# Patient Record
Sex: Male | Born: 1975 | Race: White | Hispanic: No | Marital: Single | Smoking: Never smoker
Health system: Southern US, Community
[De-identification: ages and names within clinical notes are randomized; demographics above are authoritative.]

## PROBLEM LIST (undated history)

## (undated) DIAGNOSIS — K219 Gastro-esophageal reflux disease without esophagitis: Secondary | ICD-10-CM

## (undated) DIAGNOSIS — G43019 Migraine without aura, intractable, without status migrainosus: Secondary | ICD-10-CM

## (undated) DIAGNOSIS — I10 Essential (primary) hypertension: Secondary | ICD-10-CM

## (undated) DIAGNOSIS — G43909 Migraine, unspecified, not intractable, without status migrainosus: Secondary | ICD-10-CM

## (undated) DIAGNOSIS — J45909 Unspecified asthma, uncomplicated: Secondary | ICD-10-CM

## (undated) HISTORY — DX: Gastro-esophageal reflux disease without esophagitis: K21.9

## (undated) HISTORY — DX: Migraine, unspecified, not intractable, without status migrainosus: G43.909

## (undated) HISTORY — DX: Unspecified asthma, uncomplicated: J45.909

## (undated) HISTORY — DX: Migraine without aura, intractable, without status migrainosus: G43.019

---

## 2001-06-14 HISTORY — PX: TUMOR EXCISION: SHX421

## 2015-05-27 ENCOUNTER — Other Ambulatory Visit: Payer: Self-pay | Admitting: Orthopaedic Surgery

## 2015-05-27 DIAGNOSIS — M5000 Cervical disc disorder with myelopathy, unspecified cervical region: Secondary | ICD-10-CM

## 2015-06-18 ENCOUNTER — Ambulatory Visit
Admission: RE | Admit: 2015-06-18 | Discharge: 2015-06-18 | Disposition: A | Discharge status: Home / Self Care | Payer: BLUE CROSS/BLUE SHIELD | Source: Ambulatory Visit | Attending: Orthopaedic Surgery | Admitting: Orthopaedic Surgery

## 2015-06-18 DIAGNOSIS — M5000 Cervical disc disorder with myelopathy, unspecified cervical region: Secondary | ICD-10-CM

## 2016-04-26 DIAGNOSIS — R200 Anesthesia of skin: Secondary | ICD-10-CM | POA: Diagnosis not present

## 2016-04-26 DIAGNOSIS — J4530 Mild persistent asthma, uncomplicated: Secondary | ICD-10-CM | POA: Diagnosis not present

## 2016-04-26 DIAGNOSIS — M79602 Pain in left arm: Secondary | ICD-10-CM | POA: Diagnosis not present

## 2016-04-26 DIAGNOSIS — Z23 Encounter for immunization: Secondary | ICD-10-CM | POA: Diagnosis not present

## 2016-06-17 ENCOUNTER — Telehealth: Payer: Self-pay

## 2016-06-17 ENCOUNTER — Ambulatory Visit: Payer: BLUE CROSS/BLUE SHIELD | Admitting: Neurology

## 2016-06-17 NOTE — Telephone Encounter (Signed)
Pt no showed his new patient appt today. 

## 2016-06-22 ENCOUNTER — Encounter: Payer: Self-pay | Admitting: Neurology

## 2016-07-07 ENCOUNTER — Encounter: Payer: Self-pay | Admitting: Neurology

## 2016-07-07 ENCOUNTER — Ambulatory Visit (INDEPENDENT_AMBULATORY_CARE_PROVIDER_SITE_OTHER): Payer: BLUE CROSS/BLUE SHIELD | Admitting: Neurology

## 2016-07-07 VITALS — BP 141/101 | HR 79 | Ht 68.0 in | Wt 230.5 lb

## 2016-07-07 DIAGNOSIS — Z5181 Encounter for therapeutic drug level monitoring: Secondary | ICD-10-CM

## 2016-07-07 DIAGNOSIS — G43019 Migraine without aura, intractable, without status migrainosus: Secondary | ICD-10-CM | POA: Diagnosis not present

## 2016-07-07 DIAGNOSIS — H5310 Unspecified subjective visual disturbances: Secondary | ICD-10-CM

## 2016-07-07 DIAGNOSIS — R202 Paresthesia of skin: Secondary | ICD-10-CM | POA: Diagnosis not present

## 2016-07-07 HISTORY — DX: Migraine without aura, intractable, without status migrainosus: G43.019

## 2016-07-07 NOTE — Progress Notes (Signed)
Reason for visit: Left sided numbness  Referring physician: Dr. Loren Racer Troy Levy is a 41 y.o. male  History of present illness:  Troy Levy is a 41 year old right-handed white male with a history of migraine headache throughout his life. The headache has been very frequent, and has been daily at times. The patient still has very frequent headaches, most days of the week will be associated with some headache. He indicates that he has been treated with Depakote and Topamax and Imitrex in the past without benefit. The headaches are tolerable for him at this point. The patient indicates that he has had a history of several concussions in the past. He has had some visual field changes with total loss of vision that would come and go, unassociated with the headache. The patient indicates that the headaches are associated with weather changes as a frequent activator. The patient comes to this office for an evaluation of left-sided paresthesias that began in October 2016. The patient claims that he has had similar episodes in the past that have resolved. This most recent episode has persisted over the last several months. The patient noted onset of left neck and shoulder discomfort in August 2016, with onset of discomfort down the left arm and numbness in the left hand. Within several weeks, the patient began developing numbness in the left foot spreading up the leg, and by January 2017 the left face was involved with numbness as well. The patient was seen by Dr. Noel Gerold, and MRI evaluation of the cervical spine was done and was relatively unremarkable without a source of the left arm and shoulder discomfort noted. The patient denies any weakness of the extremities, or any change in balance. He denies issues controlling the bowels or the bladder. He does believe that there has been some change in vision of the left eye. He still has some left neck and shoulder discomfort, may have some mild pain when  turning his head to the left. The patient claims that he has had nerve conduction studies done within the last year and a half and he was told that he had "nerve damage" in the left arm. The results of this study are not available to me. He comes to this office for further evaluation.  Past Medical History:  Diagnosis Date  . Acid reflux   . Asthma   . Common migraine with intractable migraine 07/07/2016  . Migraine     Past Surgical History:  Procedure Laterality Date  . TUMOR EXCISION  2003   Upper spine    Family History  Problem Relation Age of Onset  . Diabetes Mother   . Diabetes Father   . High blood pressure Father   . High Cholesterol Father     Social history:  reports that he has never smoked. He has never used smokeless tobacco. He reports that he does not drink alcohol or use drugs.  Medications:  Prior to Admission medications   Medication Sig Start Date End Date Taking? Authorizing Provider  DULERA 100-5 MCG/ACT AERO Inhale 2 puffs into the lungs 2 (two) times daily. 06/19/16  Yes Historical Provider, MD  Multiple Vitamins-Minerals (CENTRUM ADULTS PO) Take 1 tablet by mouth daily.   Yes Historical Provider, MD  omeprazole (PRILOSEC) 20 MG capsule Take 20 mg by mouth daily as needed.  05/23/16  Yes Historical Provider, MD     No Known Allergies  ROS:  Out of a complete 14 system review of symptoms, the patient  complains only of the following symptoms, and all other reviewed systems are negative.  Numbness  Blood pressure (!) 141/101, pulse 79, height 5\' 8"  (1.727 m), weight 230 lb 8 oz (104.6 kg).  Physical Exam  General: The patient is alert and cooperative at the time of the examination. The patient is moderately obese.  Eyes: Pupils are equal, round, and reactive to light. Discs are flat bilaterally.  Neck: The neck is supple, no carotid bruits are noted.  Respiratory: The respiratory examination is clear.  Cardiovascular: The cardiovascular  examination reveals a regular rate and rhythm, no obvious murmurs or rubs are noted.  Skin: Extremities are without significant edema.  Neurologic Exam  Mental status: The patient is alert and oriented x 3 at the time of the examination. The patient has apparent normal recent and remote memory, with an apparently normal attention span and concentration ability.  Cranial nerves: Facial symmetry is present. There is good sensation of the face to pinprick and soft touch on the right, decreased on the left face. The patient splits the midline with vibration sensation, decreased on the left. The strength of the facial muscles and the muscles to head turning and shoulder shrug are normal bilaterally. Speech is well enunciated, no aphasia or dysarthria is noted. Extraocular movements are full. Visual fields are full. The tongue is midline, and the patient has symmetric elevation of the soft palate. No obvious hearing deficits are noted.  Motor: The motor testing reveals 5 over 5 strength of all 4 extremities. Good symmetric motor tone is noted throughout.  Sensory: Sensory testing is intact to pinprick, soft touch, vibration sensation, and position sense on the right extremities. The patient reports decreased pinprick and vibration sensation on the left arm and left leg, position sense is intact on all 4 extremities. No evidence of extinction is noted.  Coordination: Cerebellar testing reveals good finger-nose-finger and heel-to-shin bilaterally.  Gait and station: Gait is normal. Tandem gait is normal. Romberg is negative. No drift is seen.  Reflexes: Deep tendon reflexes are symmetric and normal bilaterally. Toes are downgoing bilaterally.   MRI cervical 06/18/15:  IMPRESSION: Overall mild cervical spine degeneration, congruent with age. No cervical spinal stenosis. 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  * MRI scan images  were reviewed online. I agree with the written report.    Assessment/Plan:  1. History of common migraine headache  2. Left hemisensory deficit  3. Nonorganic sensory examination  The clinical examination is objectively unremarkable, the patient reports a left-sided sensory deficit, but he splits the midline of the forehead with vibration sensation suggesting a nonorganic etiology. The patient also reports decreased vision in the left eye. Given his age and clinical symptoms, and evaluation to exclude demyelinating disease will need to be undertaken. MRI of the brain will be done with and without gadolinium enhancement, a visual evoked response test will be done. A BMET will be done to document renal function prior to the MRI. If the above studies are unremarkable, I would not pursue further neurologic evaluation. The patient apparently has had episodes of left-sided numbness in the past that have been unassociated with migraine. Migraine may cause strokelike symptoms. It is not clear that his current symptoms are related in any way to his headache.  Troy Levy. Troy Lamisha Roussell MD 07/07/2016 10:14 AM  [facility] [address] [place], [place] [id number]  Phone [phone] Fax [phone]

## 2016-07-07 NOTE — Patient Instructions (Signed)
   We will check a MRI of the brain and get a VER study to look at the left eye function.

## 2016-07-08 ENCOUNTER — Telehealth: Payer: Self-pay | Admitting: *Deleted

## 2016-07-08 LAB — BASIC METABOLIC PANEL
BUN / CREAT RATIO: 10 (ref 9–20)
BUN: 13 mg/dL (ref 6–20)
CHLORIDE: 103 mmol/L (ref 96–106)
CO2: 23 mmol/L (ref 18–29)
Calcium: 10.4 mg/dL — ABNORMAL HIGH (ref 8.7–10.2)
Creatinine, Ser: 1.24 mg/dL (ref 0.76–1.27)
GFR calc non Af Amer: 73 mL/min/{1.73_m2} (ref >59)
GFR, EST AFRICAN AMERICAN: 85 mL/min/{1.73_m2} (ref >59)
Glucose: 84 mg/dL (ref 65–99)
POTASSIUM: 5.3 mmol/L — AB (ref 3.5–5.2)
Sodium: 143 mmol/L (ref 134–144)

## 2016-07-08 NOTE — Telephone Encounter (Signed)
Attempted to reach patient with lab results. No answer and voice mailbox full.

## 2016-07-21 ENCOUNTER — Ambulatory Visit (INDEPENDENT_AMBULATORY_CARE_PROVIDER_SITE_OTHER): Payer: BLUE CROSS/BLUE SHIELD

## 2016-07-21 DIAGNOSIS — G43019 Migraine without aura, intractable, without status migrainosus: Secondary | ICD-10-CM | POA: Diagnosis not present

## 2016-07-21 DIAGNOSIS — R202 Paresthesia of skin: Secondary | ICD-10-CM | POA: Diagnosis not present

## 2016-07-21 MED ORDER — GADOPENTETATE DIMEGLUMINE 469.01 MG/ML IV SOLN: 20.0000 mL | Freq: Once | INTRAVENOUS | Status: AC | PRN

## 2016-07-23 ENCOUNTER — Ambulatory Visit (INDEPENDENT_AMBULATORY_CARE_PROVIDER_SITE_OTHER): Payer: BLUE CROSS/BLUE SHIELD

## 2016-07-23 DIAGNOSIS — H5310 Unspecified subjective visual disturbances: Secondary | ICD-10-CM

## 2016-07-24 ENCOUNTER — Telehealth: Payer: Self-pay | Admitting: Neurology

## 2016-07-24 NOTE — Telephone Encounter (Signed)
I called the patient. The MRI of the brain is normal. VER pending.   MRI brain 07/22/16:  IMPRESSION:  Normal MRI brain (with and without).

## 2016-07-26 ENCOUNTER — Telehealth: Payer: Self-pay | Admitting: Neurology

## 2016-07-26 MED ORDER — TOPIRAMATE 25 MG PO TABS: ORAL_TABLET | ORAL | 3 refills | Status: DC

## 2016-07-26 NOTE — Telephone Encounter (Signed)
Called and scheduled f/u with MM,NP on 09/20/16 at 2:00pm per CW,MD request. Advised pt to check in 1:30pm. Pt verbalized understanding.

## 2016-07-26 NOTE — Telephone Encounter (Signed)
I called the patient. Visual evoked response test is normal. Patient continues to have visual complaints and sensory complaints, I will treat for migraine, start Topamax, follow-up in 2 or 3 months.  MRI the brain and visual evoked response test were normal.

## 2016-07-26 NOTE — Procedures (Signed)
    History:   Troy ReedyRobert Petko is a 41 year old gentleman with a history of migraine headaches. The patient has had problems with left-sided numbness, some episodes of visual loss on the left eye. The patient is being evaluated for this issue.  Description: The visual evoked response test was performed today using 32 x 32 check sizes. The absolute latencies for the N1 and the P100 wave forms were within normal limits bilaterally. The amplitudes for the P100 wave forms were also within normal limits bilaterally. The visual acuity was 20/20 OD and 20/30 OS uncorrected.  Impression:  The visual evoked response test above was within normal limits bilaterally. No evidence of conduction slowing was seen within the anterior visual pathways on either side on today's evaluation.

## 2016-09-20 ENCOUNTER — Ambulatory Visit: Payer: Self-pay | Admitting: Adult Health

## 2016-09-21 ENCOUNTER — Encounter: Payer: Self-pay | Admitting: Adult Health

## 2016-10-11 DIAGNOSIS — J029 Acute pharyngitis, unspecified: Secondary | ICD-10-CM | POA: Diagnosis not present

## 2016-10-11 DIAGNOSIS — J309 Allergic rhinitis, unspecified: Secondary | ICD-10-CM | POA: Diagnosis not present

## 2016-10-18 ENCOUNTER — Other Ambulatory Visit: Payer: Self-pay | Admitting: *Deleted

## 2016-10-18 MED ORDER — TOPIRAMATE 25 MG PO TABS: ORAL_TABLET | ORAL | 0 refills | Status: DC

## 2016-12-23 DIAGNOSIS — H18899 Other specified disorders of cornea, unspecified eye: Secondary | ICD-10-CM | POA: Diagnosis not present

## 2016-12-23 DIAGNOSIS — H209 Unspecified iridocyclitis: Secondary | ICD-10-CM | POA: Diagnosis not present

## 2016-12-29 DIAGNOSIS — H18899 Other specified disorders of cornea, unspecified eye: Secondary | ICD-10-CM | POA: Diagnosis not present

## 2016-12-29 DIAGNOSIS — H209 Unspecified iridocyclitis: Secondary | ICD-10-CM | POA: Diagnosis not present

## 2017-01-20 ENCOUNTER — Other Ambulatory Visit: Payer: Self-pay | Admitting: Neurology

## 2017-01-20 NOTE — Telephone Encounter (Signed)
Called and LVM for pt. Advised we received refill request for topiramate. Pt missed appt back in April. Dr Anne HahnWillis wanted him to f/u then. Asked him to call back and make another appt.

## 2018-02-21 DIAGNOSIS — M9901 Segmental and somatic dysfunction of cervical region: Secondary | ICD-10-CM | POA: Diagnosis not present

## 2018-02-21 DIAGNOSIS — M50123 Cervical disc disorder at C6-C7 level with radiculopathy: Secondary | ICD-10-CM | POA: Diagnosis not present

## 2018-02-21 DIAGNOSIS — M9902 Segmental and somatic dysfunction of thoracic region: Secondary | ICD-10-CM | POA: Diagnosis not present

## 2018-02-23 DIAGNOSIS — M9901 Segmental and somatic dysfunction of cervical region: Secondary | ICD-10-CM | POA: Diagnosis not present

## 2018-02-23 DIAGNOSIS — M9902 Segmental and somatic dysfunction of thoracic region: Secondary | ICD-10-CM | POA: Diagnosis not present

## 2018-02-23 DIAGNOSIS — M50123 Cervical disc disorder at C6-C7 level with radiculopathy: Secondary | ICD-10-CM | POA: Diagnosis not present

## 2018-02-27 DIAGNOSIS — M50123 Cervical disc disorder at C6-C7 level with radiculopathy: Secondary | ICD-10-CM | POA: Diagnosis not present

## 2018-02-27 DIAGNOSIS — M9901 Segmental and somatic dysfunction of cervical region: Secondary | ICD-10-CM | POA: Diagnosis not present

## 2018-02-27 DIAGNOSIS — M9902 Segmental and somatic dysfunction of thoracic region: Secondary | ICD-10-CM | POA: Diagnosis not present

## 2018-03-01 DIAGNOSIS — M9902 Segmental and somatic dysfunction of thoracic region: Secondary | ICD-10-CM | POA: Diagnosis not present

## 2018-03-01 DIAGNOSIS — M50123 Cervical disc disorder at C6-C7 level with radiculopathy: Secondary | ICD-10-CM | POA: Diagnosis not present

## 2018-03-01 DIAGNOSIS — M9901 Segmental and somatic dysfunction of cervical region: Secondary | ICD-10-CM | POA: Diagnosis not present

## 2018-03-03 DIAGNOSIS — M9902 Segmental and somatic dysfunction of thoracic region: Secondary | ICD-10-CM | POA: Diagnosis not present

## 2018-03-03 DIAGNOSIS — M9901 Segmental and somatic dysfunction of cervical region: Secondary | ICD-10-CM | POA: Diagnosis not present

## 2018-03-03 DIAGNOSIS — M50123 Cervical disc disorder at C6-C7 level with radiculopathy: Secondary | ICD-10-CM | POA: Diagnosis not present

## 2018-03-06 DIAGNOSIS — M50123 Cervical disc disorder at C6-C7 level with radiculopathy: Secondary | ICD-10-CM | POA: Diagnosis not present

## 2018-03-06 DIAGNOSIS — M9901 Segmental and somatic dysfunction of cervical region: Secondary | ICD-10-CM | POA: Diagnosis not present

## 2018-03-06 DIAGNOSIS — M9902 Segmental and somatic dysfunction of thoracic region: Secondary | ICD-10-CM | POA: Diagnosis not present

## 2018-03-08 DIAGNOSIS — M50123 Cervical disc disorder at C6-C7 level with radiculopathy: Secondary | ICD-10-CM | POA: Diagnosis not present

## 2018-03-08 DIAGNOSIS — M9902 Segmental and somatic dysfunction of thoracic region: Secondary | ICD-10-CM | POA: Diagnosis not present

## 2018-03-08 DIAGNOSIS — M9901 Segmental and somatic dysfunction of cervical region: Secondary | ICD-10-CM | POA: Diagnosis not present

## 2018-03-09 DIAGNOSIS — M9902 Segmental and somatic dysfunction of thoracic region: Secondary | ICD-10-CM | POA: Diagnosis not present

## 2018-03-09 DIAGNOSIS — M9901 Segmental and somatic dysfunction of cervical region: Secondary | ICD-10-CM | POA: Diagnosis not present

## 2018-03-09 DIAGNOSIS — M50123 Cervical disc disorder at C6-C7 level with radiculopathy: Secondary | ICD-10-CM | POA: Diagnosis not present

## 2018-03-13 DIAGNOSIS — M9902 Segmental and somatic dysfunction of thoracic region: Secondary | ICD-10-CM | POA: Diagnosis not present

## 2018-03-13 DIAGNOSIS — M9901 Segmental and somatic dysfunction of cervical region: Secondary | ICD-10-CM | POA: Diagnosis not present

## 2018-03-13 DIAGNOSIS — M50123 Cervical disc disorder at C6-C7 level with radiculopathy: Secondary | ICD-10-CM | POA: Diagnosis not present

## 2018-03-16 DIAGNOSIS — M9902 Segmental and somatic dysfunction of thoracic region: Secondary | ICD-10-CM | POA: Diagnosis not present

## 2018-03-16 DIAGNOSIS — M9901 Segmental and somatic dysfunction of cervical region: Secondary | ICD-10-CM | POA: Diagnosis not present

## 2018-03-16 DIAGNOSIS — M50123 Cervical disc disorder at C6-C7 level with radiculopathy: Secondary | ICD-10-CM | POA: Diagnosis not present

## 2018-03-20 DIAGNOSIS — M9901 Segmental and somatic dysfunction of cervical region: Secondary | ICD-10-CM | POA: Diagnosis not present

## 2018-03-20 DIAGNOSIS — M50123 Cervical disc disorder at C6-C7 level with radiculopathy: Secondary | ICD-10-CM | POA: Diagnosis not present

## 2018-03-20 DIAGNOSIS — M9902 Segmental and somatic dysfunction of thoracic region: Secondary | ICD-10-CM | POA: Diagnosis not present

## 2018-03-23 DIAGNOSIS — M9901 Segmental and somatic dysfunction of cervical region: Secondary | ICD-10-CM | POA: Diagnosis not present

## 2018-03-23 DIAGNOSIS — M9902 Segmental and somatic dysfunction of thoracic region: Secondary | ICD-10-CM | POA: Diagnosis not present

## 2018-03-23 DIAGNOSIS — M50123 Cervical disc disorder at C6-C7 level with radiculopathy: Secondary | ICD-10-CM | POA: Diagnosis not present

## 2018-03-27 DIAGNOSIS — M50123 Cervical disc disorder at C6-C7 level with radiculopathy: Secondary | ICD-10-CM | POA: Diagnosis not present

## 2018-03-27 DIAGNOSIS — M9901 Segmental and somatic dysfunction of cervical region: Secondary | ICD-10-CM | POA: Diagnosis not present

## 2018-03-27 DIAGNOSIS — M9902 Segmental and somatic dysfunction of thoracic region: Secondary | ICD-10-CM | POA: Diagnosis not present

## 2018-03-30 DIAGNOSIS — M9902 Segmental and somatic dysfunction of thoracic region: Secondary | ICD-10-CM | POA: Diagnosis not present

## 2018-03-30 DIAGNOSIS — M9901 Segmental and somatic dysfunction of cervical region: Secondary | ICD-10-CM | POA: Diagnosis not present

## 2018-03-30 DIAGNOSIS — M50123 Cervical disc disorder at C6-C7 level with radiculopathy: Secondary | ICD-10-CM | POA: Diagnosis not present

## 2018-04-05 DIAGNOSIS — M9901 Segmental and somatic dysfunction of cervical region: Secondary | ICD-10-CM | POA: Diagnosis not present

## 2018-04-05 DIAGNOSIS — M9902 Segmental and somatic dysfunction of thoracic region: Secondary | ICD-10-CM | POA: Diagnosis not present

## 2018-04-05 DIAGNOSIS — M50123 Cervical disc disorder at C6-C7 level with radiculopathy: Secondary | ICD-10-CM | POA: Diagnosis not present

## 2018-04-12 DIAGNOSIS — M50123 Cervical disc disorder at C6-C7 level with radiculopathy: Secondary | ICD-10-CM | POA: Diagnosis not present

## 2018-04-12 DIAGNOSIS — M9901 Segmental and somatic dysfunction of cervical region: Secondary | ICD-10-CM | POA: Diagnosis not present

## 2018-04-12 DIAGNOSIS — M9902 Segmental and somatic dysfunction of thoracic region: Secondary | ICD-10-CM | POA: Diagnosis not present

## 2018-04-18 DIAGNOSIS — M9902 Segmental and somatic dysfunction of thoracic region: Secondary | ICD-10-CM | POA: Diagnosis not present

## 2018-04-18 DIAGNOSIS — M50123 Cervical disc disorder at C6-C7 level with radiculopathy: Secondary | ICD-10-CM | POA: Diagnosis not present

## 2018-04-18 DIAGNOSIS — M9901 Segmental and somatic dysfunction of cervical region: Secondary | ICD-10-CM | POA: Diagnosis not present

## 2018-04-25 DIAGNOSIS — M9902 Segmental and somatic dysfunction of thoracic region: Secondary | ICD-10-CM | POA: Diagnosis not present

## 2018-04-25 DIAGNOSIS — M50123 Cervical disc disorder at C6-C7 level with radiculopathy: Secondary | ICD-10-CM | POA: Diagnosis not present

## 2018-04-25 DIAGNOSIS — M9901 Segmental and somatic dysfunction of cervical region: Secondary | ICD-10-CM | POA: Diagnosis not present

## 2018-05-01 DIAGNOSIS — R05 Cough: Secondary | ICD-10-CM | POA: Diagnosis not present

## 2018-05-01 DIAGNOSIS — J069 Acute upper respiratory infection, unspecified: Secondary | ICD-10-CM | POA: Diagnosis not present

## 2018-05-02 DIAGNOSIS — M9901 Segmental and somatic dysfunction of cervical region: Secondary | ICD-10-CM | POA: Diagnosis not present

## 2018-05-02 DIAGNOSIS — M50123 Cervical disc disorder at C6-C7 level with radiculopathy: Secondary | ICD-10-CM | POA: Diagnosis not present

## 2018-05-02 DIAGNOSIS — M9902 Segmental and somatic dysfunction of thoracic region: Secondary | ICD-10-CM | POA: Diagnosis not present

## 2018-06-17 DIAGNOSIS — I10 Essential (primary) hypertension: Secondary | ICD-10-CM | POA: Diagnosis not present

## 2018-06-23 DIAGNOSIS — Z131 Encounter for screening for diabetes mellitus: Secondary | ICD-10-CM | POA: Diagnosis not present

## 2018-06-23 DIAGNOSIS — Z0000 Encounter for general adult medical examination without abnormal findings: Secondary | ICD-10-CM | POA: Diagnosis not present

## 2018-06-23 DIAGNOSIS — K625 Hemorrhage of anus and rectum: Secondary | ICD-10-CM | POA: Diagnosis not present

## 2018-06-23 DIAGNOSIS — Z23 Encounter for immunization: Secondary | ICD-10-CM | POA: Diagnosis not present

## 2018-06-23 DIAGNOSIS — Z13220 Encounter for screening for lipoid disorders: Secondary | ICD-10-CM | POA: Diagnosis not present

## 2018-06-23 DIAGNOSIS — R030 Elevated blood-pressure reading, without diagnosis of hypertension: Secondary | ICD-10-CM | POA: Diagnosis not present

## 2019-01-26 DIAGNOSIS — M25562 Pain in left knee: Secondary | ICD-10-CM | POA: Diagnosis not present

## 2019-02-12 DIAGNOSIS — H66001 Acute suppurative otitis media without spontaneous rupture of ear drum, right ear: Secondary | ICD-10-CM | POA: Diagnosis not present

## 2019-02-23 DIAGNOSIS — H60331 Swimmer's ear, right ear: Secondary | ICD-10-CM | POA: Diagnosis not present

## 2019-02-23 DIAGNOSIS — S0921XA Traumatic rupture of right ear drum, initial encounter: Secondary | ICD-10-CM | POA: Diagnosis not present

## 2019-03-12 DIAGNOSIS — H7291 Unspecified perforation of tympanic membrane, right ear: Secondary | ICD-10-CM | POA: Diagnosis not present

## 2019-03-12 DIAGNOSIS — H9011 Conductive hearing loss, unilateral, right ear, with unrestricted hearing on the contralateral side: Secondary | ICD-10-CM | POA: Diagnosis not present

## 2019-03-20 DIAGNOSIS — H7291 Unspecified perforation of tympanic membrane, right ear: Secondary | ICD-10-CM | POA: Diagnosis not present

## 2019-03-28 DIAGNOSIS — Z20828 Contact with and (suspected) exposure to other viral communicable diseases: Secondary | ICD-10-CM | POA: Diagnosis not present

## 2019-04-06 DIAGNOSIS — Z23 Encounter for immunization: Secondary | ICD-10-CM | POA: Diagnosis not present

## 2019-04-06 DIAGNOSIS — E782 Mixed hyperlipidemia: Secondary | ICD-10-CM | POA: Diagnosis not present

## 2019-04-06 DIAGNOSIS — R0602 Shortness of breath: Secondary | ICD-10-CM | POA: Diagnosis not present

## 2019-04-06 DIAGNOSIS — R030 Elevated blood-pressure reading, without diagnosis of hypertension: Secondary | ICD-10-CM | POA: Diagnosis not present

## 2019-05-02 DIAGNOSIS — S0921XA Traumatic rupture of right ear drum, initial encounter: Secondary | ICD-10-CM | POA: Diagnosis not present

## 2019-06-05 DIAGNOSIS — H7291 Unspecified perforation of tympanic membrane, right ear: Secondary | ICD-10-CM | POA: Diagnosis not present

## 2019-06-05 DIAGNOSIS — H9211 Otorrhea, right ear: Secondary | ICD-10-CM | POA: Diagnosis not present

## 2019-06-05 DIAGNOSIS — H938X2 Other specified disorders of left ear: Secondary | ICD-10-CM | POA: Diagnosis not present

## 2019-06-12 DIAGNOSIS — U071 COVID-19: Secondary | ICD-10-CM | POA: Diagnosis not present

## 2019-06-12 DIAGNOSIS — Z20828 Contact with and (suspected) exposure to other viral communicable diseases: Secondary | ICD-10-CM | POA: Diagnosis not present

## 2019-06-13 ENCOUNTER — Telehealth: Payer: Self-pay | Admitting: Nurse Practitioner

## 2019-06-13 NOTE — Telephone Encounter (Signed)
Called to Discuss with patient about Covid symptoms and the use of bamlanivimab, a monoclonal antibody infusion for those with mild to moderate Covid symptoms and at a high risk of hospitalization.     Pt is qualified for this infusion at the [facility] due to co-morbid conditions and/or a member of an at-risk group.     Unable to reach pt  

## 2019-06-19 ENCOUNTER — Emergency Department (HOSPITAL_BASED_OUTPATIENT_CLINIC_OR_DEPARTMENT_OTHER): Payer: BC Managed Care – PPO

## 2019-06-19 ENCOUNTER — Emergency Department (HOSPITAL_BASED_OUTPATIENT_CLINIC_OR_DEPARTMENT_OTHER)
Admission: EM | Admit: 2019-06-19 | Discharge: 2019-06-19 | Disposition: A | Discharge status: Home / Self Care | Payer: BC Managed Care – PPO | Attending: Emergency Medicine | Admitting: Emergency Medicine

## 2019-06-19 ENCOUNTER — Encounter (HOSPITAL_BASED_OUTPATIENT_CLINIC_OR_DEPARTMENT_OTHER): Payer: Self-pay | Admitting: Emergency Medicine

## 2019-06-19 ENCOUNTER — Other Ambulatory Visit: Payer: Self-pay

## 2019-06-19 DIAGNOSIS — U071 COVID-19: Secondary | ICD-10-CM | POA: Insufficient documentation

## 2019-06-19 DIAGNOSIS — R0602 Shortness of breath: Secondary | ICD-10-CM

## 2019-06-19 DIAGNOSIS — Z79899 Other long term (current) drug therapy: Secondary | ICD-10-CM | POA: Diagnosis not present

## 2019-06-19 DIAGNOSIS — I10 Essential (primary) hypertension: Secondary | ICD-10-CM | POA: Insufficient documentation

## 2019-06-19 DIAGNOSIS — J181 Lobar pneumonia, unspecified organism: Secondary | ICD-10-CM | POA: Diagnosis not present

## 2019-06-19 DIAGNOSIS — J45909 Unspecified asthma, uncomplicated: Secondary | ICD-10-CM | POA: Insufficient documentation

## 2019-06-19 HISTORY — DX: Essential (primary) hypertension: I10

## 2019-06-19 NOTE — Discharge Instructions (Addendum)
Continue monitoring your symptoms at home including using the pulse oximeter. If it is consistently lower than 90% please return to the ED for further evaluation.  Use your inhalers as needed. Contact [facility] in regards to discussing monoclonal antibody infusions as they have been trying to reach you.   Return to the ED for any worsening symptoms including worsening shortness of breath or chest pain.      Person Under Monitoring Name: Troy Levy  Location: [address] [place] [address]   Infection Prevention Recommendations for Individuals Confirmed to have, or Being Evaluated for, 2019 Novel Coronavirus (COVID-19) Infection Who Receive Care at Home  Individuals who are confirmed to have, or are being evaluated for, COVID-19 should follow the prevention steps below until a healthcare provider or local or state health department says they can return to normal activities.  Stay home except to get medical care You should restrict activities outside your home, except for getting medical care. Do not go to work, school, or public areas, and do not use public transportation or taxis.  Call ahead before visiting your doctor Before your medical appointment, call the healthcare provider and tell them that you have, or are being evaluated for, COVID-19 infection. This will help the healthcare provider's office take steps to keep other people from getting infected. Ask your healthcare provider to call the local or state health department.  Monitor your symptoms Seek prompt medical attention if your illness is worsening (e.g., difficulty breathing). Before going to your medical appointment, call the healthcare provider and tell them that you have, or are being evaluated for, COVID-19 infection. Ask your healthcare provider to call the local or state health department.  Wear a facemask You should wear a facemask that covers your nose and mouth when you are in the same  room with other people and when you visit a healthcare provider. People who live with or visit you should also wear a facemask while they are in the same room with you.  Separate yourself from other people in your home As much as possible, you should stay in a different room from other people in your home. Also, you should use a separate bathroom, if available.  Avoid sharing household items You should not share dishes, drinking glasses, cups, eating utensils, towels, bedding, or other items with other people in your home. After using these items, you should wash them thoroughly with soap and water.  Cover your coughs and sneezes Cover your mouth and nose with a tissue when you cough or sneeze, or you can cough or sneeze into your sleeve. Throw used tissues in a lined trash can, and immediately wash your hands with soap and water for at least 20 seconds or use an alcohol-based hand rub.  Wash your [employer] your hands often and thoroughly with soap and water for at least 20 seconds. You can use an alcohol-based hand sanitizer if soap and water are not available and if your hands are not visibly dirty. Avoid touching your eyes, nose, and mouth with unwashed hands.   Prevention Steps for Caregivers and Household Members of Individuals Confirmed to have, or Being Evaluated for, COVID-19 Infection Being Cared for in the Home  If you live with, or provide care at home for, a person confirmed to have, or being evaluated for, COVID-19 infection please follow these guidelines to prevent infection:  Follow healthcare provider's instructions Make sure that you understand and can help the patient follow any  healthcare provider instructions for all care.  Provide for the patient's basic needs You should help the patient with basic needs in the home and provide support for getting groceries, prescriptions, and other personal needs.  Monitor the patient's symptoms If they are getting sicker,  call his or her medical provider and tell them that the patient has, or is being evaluated for, COVID-19 infection. This will help the healthcare provider's office take steps to keep other people from getting infected. Ask the healthcare provider to call the local or state health department.  Limit the number of people who have contact with the patient If possible, have only one caregiver for the patient. Other household members should stay in another home or place of residence. If this is not possible, they should stay in another room, or be separated from the patient as much as possible. Use a separate bathroom, if available. Restrict visitors who do not have an essential need to be in the home.  Keep older adults, very young children, and other sick people away from the patient Keep older adults, very young children, and those who have compromised immune systems or chronic health conditions away from the patient. This includes people with chronic heart, lung, or kidney conditions, diabetes, and cancer.  Ensure good ventilation Make sure that shared spaces in the home have good air flow, such as from an air conditioner or an opened window, weather permitting.  Wash your hands often Wash your hands often and thoroughly with soap and water for at least 20 seconds. You can use an alcohol based hand sanitizer if soap and water are not available and if your hands are not visibly dirty. Avoid touching your eyes, nose, and mouth with unwashed hands. Use disposable paper towels to dry your hands. If not available, use dedicated cloth towels and replace them when they become wet.  Wear a facemask and gloves Wear a disposable facemask at all times in the room and gloves when you touch or have contact with the patient's blood, body fluids, and/or secretions or excretions, such as sweat, saliva, sputum, nasal mucus, vomit, urine, or feces.  Ensure the mask fits over your nose and mouth tightly, and do  not touch it during use. Throw out disposable facemasks and gloves after using them. Do not reuse. Wash your hands immediately after removing your facemask and gloves. If your personal clothing becomes contaminated, carefully remove clothing and launder. Wash your hands after handling contaminated clothing. Place all used disposable facemasks, gloves, and other waste in a lined container before disposing them with other household waste. Remove gloves and wash your hands immediately after handling these items.  Do not share dishes, glasses, or other household items with the patient Avoid sharing household items. You should not share dishes, drinking glasses, cups, eating utensils, towels, bedding, or other items with a patient who is confirmed to have, or being evaluated for, COVID-19 infection. After the person uses these items, you should wash them thoroughly with soap and water.  Wash laundry thoroughly Immediately remove and wash clothes or bedding that have blood, body fluids, and/or secretions or excretions, such as sweat, saliva, sputum, nasal mucus, vomit, urine, or feces, on them. Wear gloves when handling laundry from the patient. Read and follow directions on labels of laundry or clothing items and detergent. In general, wash and dry with the warmest temperatures recommended on the label.  Clean all areas the individual has used often Clean all touchable surfaces, such as counters, tabletops,  doorknobs, bathroom fixtures, toilets, phones, keyboards, tablets, and bedside tables, every day. Also, clean any surfaces that may have blood, body fluids, and/or secretions or excretions on them. Wear gloves when cleaning surfaces the patient has come in contact with. Use a diluted bleach solution (e.g., dilute bleach with 1 part bleach and 10 parts water) or a household disinfectant with a label that says EPA-registered for coronaviruses. To make a bleach solution at home, add 1 tablespoon of  bleach to 1 quart (4 cups) of water. For a larger supply, add  cup of bleach to 1 gallon (16 cups) of water. Read labels of cleaning products and follow recommendations provided on product labels. Labels contain instructions for safe and effective use of the cleaning product including precautions you should take when applying the product, such as wearing gloves or eye protection and making sure you have good ventilation during use of the product. Remove gloves and wash hands immediately after cleaning.  Monitor yourself for signs and symptoms of illness Caregivers and household members are considered close contacts, should monitor their health, and will be asked to limit movement outside of the home to the extent possible. Follow the monitoring steps for close contacts listed on the symptom monitoring form.   ? If you have additional questions, contact your local health department or call the epidemiologist on call at [phone] (available 24/7). ? This guidance is subject to change. For the most up-to-date guidance from [facility], please refer to their website: [email or web address]

## 2019-06-19 NOTE — ED Provider Notes (Addendum)
[facility] EMERGENCY DEPARTMENT Provider Note   CSN: [id number] Arrival date & time: 06/19/19  1033     History Chief Complaint  Patient presents with   Shortness of Breath    COVID+    Troy Levy is a 44 y.o. male who is COVID pos (diagnosed 1 week ago) who presents to the ED today with ongoing feelings of shortness of breath and wheezing. Pt reports his family wanted him to be evaluated given his hx of asthma; he reports he has been using his albuterol inhaler at home with mild relief but has heard some "Rattling" which concerned him. Pt does have a pulse ox at home and states it has consistently been above 95% on RA. No chest pain. No fevers. Pt reports he is actually feeling improved today than yesterday. Pt has never had to be intubated or hospitalized for asthma exacerbation.   The history is provided by the patient.       Past Medical History:  Diagnosis Date   Acid reflux    Asthma    Common migraine with intractable migraine 07/07/2016   Hypertension    Migraine     Patient Active Problem List   Diagnosis Date Noted   Common migraine with intractable migraine 07/07/2016   Paresthesia 07/07/2016    Past Surgical History:  Procedure Laterality Date   TUMOR EXCISION  2003   Upper spine       Family History  Problem Relation Age of Onset   Diabetes Mother    Diabetes Father    High blood pressure Father    High Cholesterol Father     Social History   Tobacco Use   Smoking status: Never Smoker   Smokeless tobacco: Never Used  Substance Use Topics   Alcohol use: No    Comment: Quit 5-6 years ago   Drug use: No    Home Medications Prior to Admission medications   Medication Sig Start Date End Date Taking? Authorizing Provider  DULERA 100-5 MCG/ACT AERO Inhale 2 puffs into the lungs 2 (two) times daily. 06/19/16   [provider]  Multiple Vitamins-Minerals (CENTRUM ADULTS PO) Take 1 tablet by mouth daily.     [provider]  omeprazole (PRILOSEC) 20 MG capsule Take 20 mg by mouth daily as needed.  05/23/16   [provider]  topiramate (TOPAMAX) 25 MG tablet TAKE 3 TABLETS BY MOUTH AT NIGHT. *NEEDS OV* 01/21/17   York Spaniel, MD    Allergies    Patient has no known allergies.  Review of Systems   Review of Systems  Constitutional: Negative for chills and fever.  Respiratory: Positive for cough and shortness of breath.   Cardiovascular: Negative for chest pain.  All other systems reviewed and are negative.   Physical Exam Updated Vital Signs BP 128/89 (BP Location: Right Arm)    Pulse (!) 110    Temp 98.4 F (36.9 C) (Oral)    Resp 16    Ht 5\' 8"  (1.727 m)    Wt 104.3 kg    SpO2 100%    BMI 34.97 kg/m   Physical Exam Vitals and nursing note reviewed.  Constitutional:      Appearance: He is not ill-appearing or diaphoretic.  HENT:     Head: Normocephalic and atraumatic.  Eyes:     Conjunctiva/sclera: Conjunctivae normal.  Cardiovascular:     Rate and Rhythm: Regular rhythm. Tachycardia present.  Pulmonary:     Effort: Pulmonary effort is  normal.     Breath sounds: Normal breath sounds. No decreased breath sounds, wheezing, rhonchi or rales.     Comments: Sitting upright comfortably in bed. Satting 98% on RA. Speaking in full sentences without difficulty. No accessory muscle use. LCTAB. No wheezing. Chest:     Chest wall: No tenderness.  Abdominal:     Palpations: Abdomen is soft.     Tenderness: There is no abdominal tenderness.  Musculoskeletal:     Cervical back: Neck supple.  Skin:    General: Skin is warm and dry.  Neurological:     Mental Status: He is alert.     ED Results / Procedures / Treatments   Labs (all labs ordered are listed, but only abnormal results are displayed) Labs Reviewed - No data to display  EKG None  Radiology DG Chest Portable 1 View  Result Date: 06/19/2019 CLINICAL DATA:  44 year old male with history of  shortness of breath. Diagnosed with COVID-19 1 week ago. EXAM: PORTABLE CHEST 1 VIEW COMPARISON:  No priors. FINDINGS: Patchy multifocal airspace consolidation in the lungs bilaterally, most evident in the periphery of the lungs (right greater than left), most severe in the right upper lobe. No pleural effusions. No evidence of pulmonary edema. No pneumothorax. Heart size is normal. Upper mediastinal contours are within normal limits. IMPRESSION: 1. Patchy multilobar bilateral (right greater than left) pneumonia, compatible with reported COVID-19 infection. Electronically Signed   By: Trudie Reed M.D.   On: 06/19/2019 12:57    Procedures Procedures (including critical care time)  Medications Ordered in ED Medications - No data to display  ED Course  I have reviewed the triage vital signs and the nursing notes.  Pertinent labs & imaging results that were available during my care of the patient were reviewed by me and considered in my medical decision making (see chart for details).  44 year old male presents the ED today for further evaluation after being diagnosed with COVID-19 1 week ago.  He states he has continued to feel short of breath and his family wanted him to be evaluated and have a chest x-ray done.  Arrival patient is afebrile and nontachypneic.  He is tachycardic in the 110s. States he has continued to be tachycardic this entire week; question if this is due to his albuterol inhaler. No complaints of chest pain or pleuritic type pain to suggest PE. While in the room pulse decreased to low 100's. Chest x-ray was obtained while patient was in the waiting room - findings consistent with Covid pneumonia.  He appears comfortable on my exam.  Lungs are clear to auscultation bilaterally there are no signs of wheezing.  He is speaking in full sentences without difficulty.  He states he has been monitoring his symptoms at home with a pulse ox and they have consistently stayed above 95% on room  air.  He states that he has been persistently tachycardic for this entire week. I personally ambulated patient in his room and he stated above 95% on room air.  Patient not actively coughing during entire exam.  I lengthy discussion with him in regard to continuing his albuterol inhaler at home.  He is to continue monitoring his symptoms with his pulse ox and if they are persistently below 90% he needs to return to the ED for further evaluation.  Otherwise advised to continue self quarantine and following up with his primary care provider.  It does appear that [facility] had attempted to reach out to discuss pt  receiving monoclonal antibodies; I have recommended he reach out to them if he is interested. Pt is in agreement with plan and stable for discharge home.   This note was prepared using Dragon voice recognition software and may include unintentional dictation errors due to the inherent limitations of voice recognition software.  Jayveion Stalling was evaluated in Emergency Department on 06/19/2019 for the symptoms described in the history of present illness. He was evaluated in the context of the global COVID-19 pandemic, which necessitated consideration that the patient might be at risk for infection with the SARS-CoV-2 virus that causes COVID-19. Institutional protocols and algorithms that pertain to the evaluation of patients at risk for COVID-19 are in a state of rapid change based on information released by regulatory bodies including the CDC and federal and state organizations. These policies and algorithms were followed during the patient's care in the ED.    [facility] Rules/Calculators/A&P                       Final Clinical Impression(s) / ED Diagnoses Final diagnoses:  COVID-19  SOB (shortness of breath)    Rx / DC Orders ED Discharge Orders    None       Eustaquio Maize, PA-C 06/19/19 1627    Eustaquio Maize, PA-C 06/19/19 1646    Gareth Morgan, MD 06/21/19 1429

## 2019-06-19 NOTE — ED Triage Notes (Signed)
Dx with COVID 1 week ago. C/o ongoing SOB. States his family wants him to have a CXR

## 2019-06-27 DIAGNOSIS — Z03818 Encounter for observation for suspected exposure to other biological agents ruled out: Secondary | ICD-10-CM | POA: Diagnosis not present

## 2019-06-27 DIAGNOSIS — Z20828 Contact with and (suspected) exposure to other viral communicable diseases: Secondary | ICD-10-CM | POA: Diagnosis not present

## 2019-07-10 DIAGNOSIS — M542 Cervicalgia: Secondary | ICD-10-CM | POA: Diagnosis not present

## 2019-07-10 DIAGNOSIS — J029 Acute pharyngitis, unspecified: Secondary | ICD-10-CM | POA: Diagnosis not present

## 2019-08-31 DIAGNOSIS — H9011 Conductive hearing loss, unilateral, right ear, with unrestricted hearing on the contralateral side: Secondary | ICD-10-CM | POA: Diagnosis not present

## 2019-08-31 DIAGNOSIS — H9210 Otorrhea, unspecified ear: Secondary | ICD-10-CM | POA: Diagnosis not present

## 2019-08-31 DIAGNOSIS — H7291 Unspecified perforation of tympanic membrane, right ear: Secondary | ICD-10-CM | POA: Diagnosis not present

## 2019-08-31 DIAGNOSIS — S0921XA Traumatic rupture of right ear drum, initial encounter: Secondary | ICD-10-CM | POA: Diagnosis not present

## 2019-09-03 DIAGNOSIS — F419 Anxiety disorder, unspecified: Secondary | ICD-10-CM | POA: Diagnosis not present

## 2019-09-03 DIAGNOSIS — R0602 Shortness of breath: Secondary | ICD-10-CM | POA: Diagnosis not present

## 2019-09-27 DIAGNOSIS — F909 Attention-deficit hyperactivity disorder, unspecified type: Secondary | ICD-10-CM | POA: Diagnosis not present

## 2019-10-04 DIAGNOSIS — M5013 Cervical disc disorder with radiculopathy, cervicothoracic region: Secondary | ICD-10-CM | POA: Diagnosis not present

## 2019-10-04 DIAGNOSIS — M5412 Radiculopathy, cervical region: Secondary | ICD-10-CM | POA: Diagnosis not present

## 2019-10-04 DIAGNOSIS — M5000 Cervical disc disorder with myelopathy, unspecified cervical region: Secondary | ICD-10-CM | POA: Diagnosis not present

## 2019-10-04 DIAGNOSIS — G545 Neuralgic amyotrophy: Secondary | ICD-10-CM | POA: Diagnosis not present

## 2019-10-11 ENCOUNTER — Other Ambulatory Visit: Payer: Self-pay | Admitting: Orthopaedic Surgery

## 2019-10-11 DIAGNOSIS — M5013 Cervical disc disorder with radiculopathy, cervicothoracic region: Secondary | ICD-10-CM

## 2019-10-26 DIAGNOSIS — F909 Attention-deficit hyperactivity disorder, unspecified type: Secondary | ICD-10-CM | POA: Diagnosis not present

## 2019-11-14 ENCOUNTER — Ambulatory Visit
Admission: RE | Admit: 2019-11-14 | Discharge: 2019-11-14 | Disposition: A | Discharge status: Home / Self Care | Payer: BC Managed Care – PPO | Source: Ambulatory Visit | Attending: Orthopaedic Surgery | Admitting: Orthopaedic Surgery

## 2019-11-14 DIAGNOSIS — M50221 Other cervical disc displacement at C4-C5 level: Secondary | ICD-10-CM | POA: Diagnosis not present

## 2019-11-14 DIAGNOSIS — M5013 Cervical disc disorder with radiculopathy, cervicothoracic region: Secondary | ICD-10-CM

## 2019-11-14 MED ORDER — GADOBENATE DIMEGLUMINE 529 MG/ML IV SOLN
20.0000 mL | Freq: Once | INTRAVENOUS | Status: AC | PRN
  Administered 2019-11-14: 20 mL via INTRAVENOUS

## 2019-11-19 DIAGNOSIS — M5013 Cervical disc disorder with radiculopathy, cervicothoracic region: Secondary | ICD-10-CM | POA: Diagnosis not present

## 2019-11-19 DIAGNOSIS — M47892 Other spondylosis, cervical region: Secondary | ICD-10-CM | POA: Diagnosis not present

## 2020-01-25 DIAGNOSIS — F909 Attention-deficit hyperactivity disorder, unspecified type: Secondary | ICD-10-CM | POA: Diagnosis not present

## 2020-01-25 DIAGNOSIS — J4530 Mild persistent asthma, uncomplicated: Secondary | ICD-10-CM | POA: Diagnosis not present

## 2020-01-25 DIAGNOSIS — I10 Essential (primary) hypertension: Secondary | ICD-10-CM | POA: Diagnosis not present

## 2020-01-25 DIAGNOSIS — E782 Mixed hyperlipidemia: Secondary | ICD-10-CM | POA: Diagnosis not present

## 2020-04-02 DIAGNOSIS — R112 Nausea with vomiting, unspecified: Secondary | ICD-10-CM | POA: Diagnosis not present

## 2020-04-02 DIAGNOSIS — Z20822 Contact with and (suspected) exposure to covid-19: Secondary | ICD-10-CM | POA: Diagnosis not present

## 2020-04-02 DIAGNOSIS — R197 Diarrhea, unspecified: Secondary | ICD-10-CM | POA: Diagnosis not present

## 2020-04-02 DIAGNOSIS — R5383 Other fatigue: Secondary | ICD-10-CM | POA: Diagnosis not present

## 2020-04-15 DIAGNOSIS — L03031 Cellulitis of right toe: Secondary | ICD-10-CM | POA: Diagnosis not present

## 2020-04-15 DIAGNOSIS — Z23 Encounter for immunization: Secondary | ICD-10-CM | POA: Diagnosis not present

## 2020-06-09 DIAGNOSIS — E782 Mixed hyperlipidemia: Secondary | ICD-10-CM | POA: Diagnosis not present

## 2020-06-09 DIAGNOSIS — J4530 Mild persistent asthma, uncomplicated: Secondary | ICD-10-CM | POA: Diagnosis not present

## 2020-06-09 DIAGNOSIS — F909 Attention-deficit hyperactivity disorder, unspecified type: Secondary | ICD-10-CM | POA: Diagnosis not present

## 2020-06-09 DIAGNOSIS — I10 Essential (primary) hypertension: Secondary | ICD-10-CM | POA: Diagnosis not present

## 2020-06-09 DIAGNOSIS — R21 Rash and other nonspecific skin eruption: Secondary | ICD-10-CM | POA: Diagnosis not present

## 2021-11-05 IMAGING — DX DG CHEST 1V PORT
1 series · 1 of 1 positions shown · non-contrast
Comparison: No priors.

CLINICAL DATA: 44-year-old male with history of shortness of
breath. Diagnosed with FOPYI-6L 1 week ago.

EXAM:
PORTABLE CHEST 1 VIEW

[chest ap]
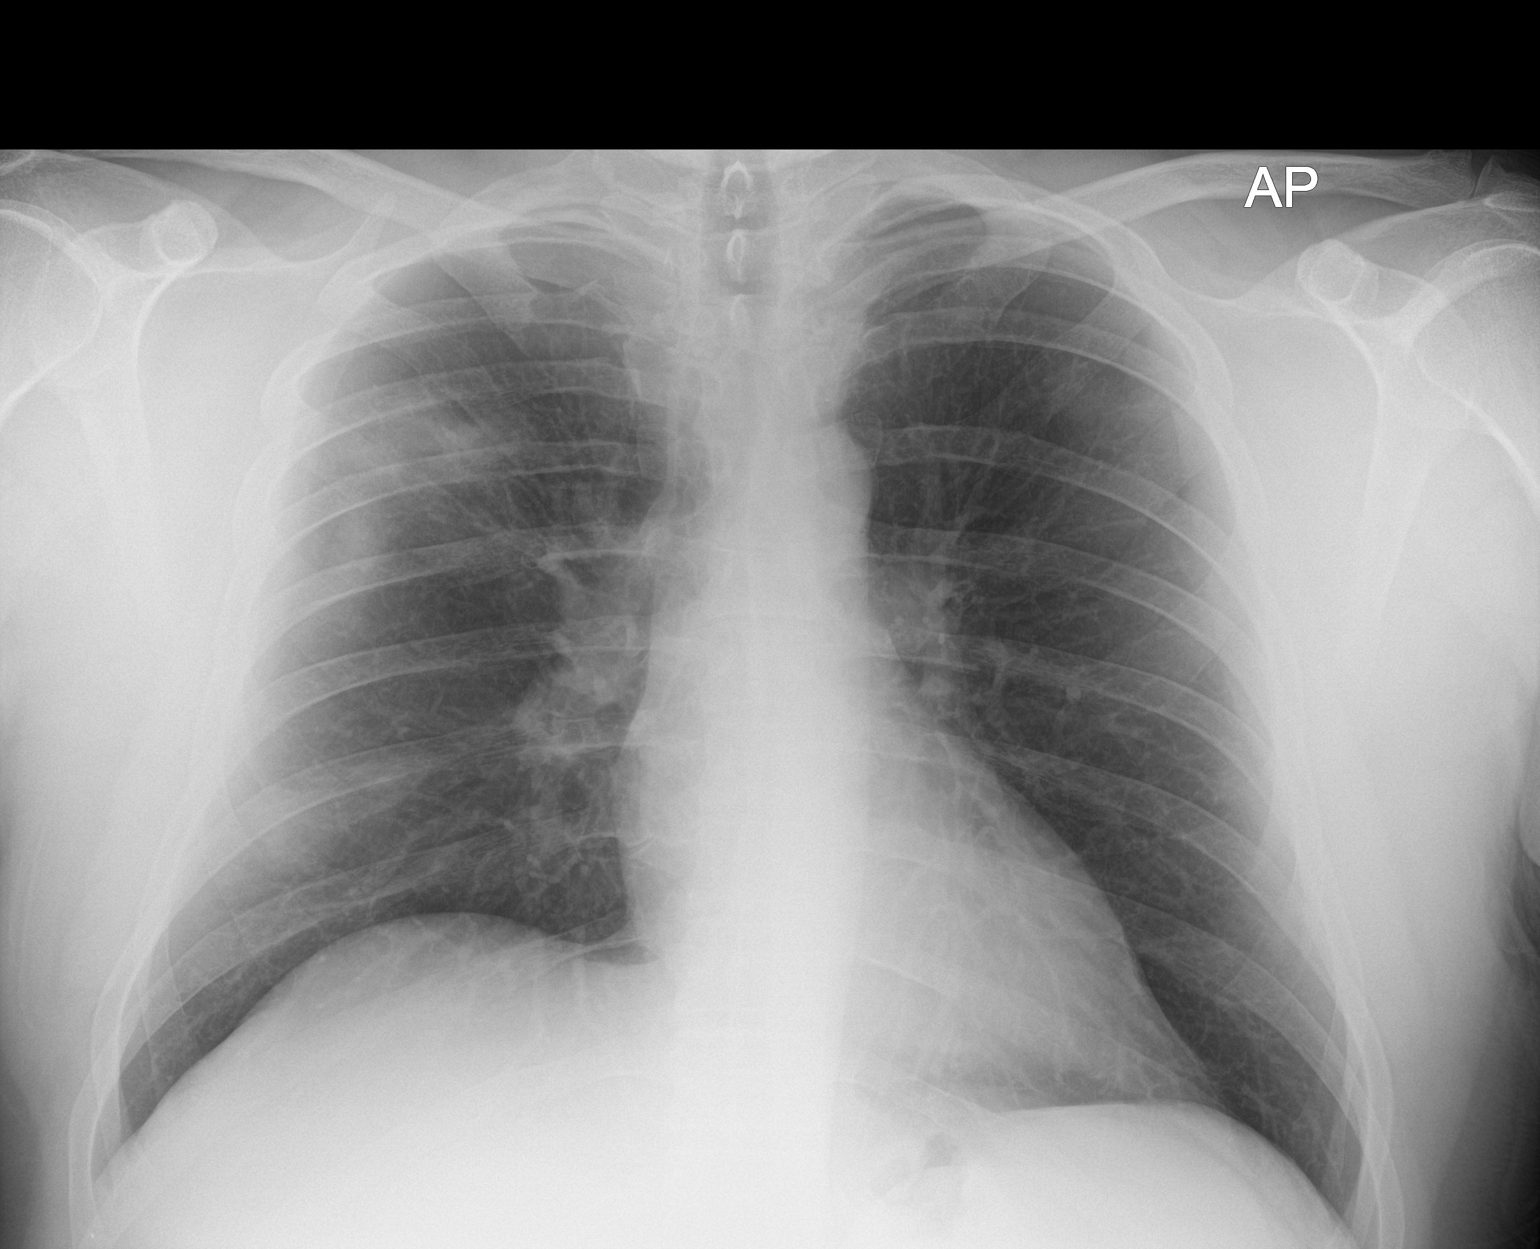

[1 of 1 positions shown; findings below may reference images not displayed]

FINDINGS: Patchy multifocal airspace consolidation in the lungs bilaterally,
most evident in the periphery of the lungs (right greater than
left), most severe in the right upper lobe. No pleural effusions. No
evidence of pulmonary edema. No pneumothorax. Heart size is normal.
Upper mediastinal contours are within normal limits.
IMPRESSION: 1. Patchy multilobar bilateral (right greater than left) pneumonia,
compatible with reported FOPYI-6L infection.
